# Patient Record
Sex: Male | Born: 2000 | Race: Black or African American | Hispanic: No | Marital: Single
Health system: Southern US, Community
[De-identification: ages and names within clinical notes are randomized; demographics above are authoritative.]

---
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
## 2021-08-04 ENCOUNTER — Emergency Department (HOSPITAL_COMMUNITY)
Admission: EM | Admit: 2021-08-04 | Discharge: 2021-08-04 | Disposition: A | Discharge status: Home / Self Care | Payer: Medicaid Other | Attending: Emergency Medicine | Admitting: Emergency Medicine

## 2021-08-04 ENCOUNTER — Encounter (HOSPITAL_COMMUNITY): Payer: Self-pay | Admitting: Emergency Medicine

## 2021-08-04 ENCOUNTER — Emergency Department (HOSPITAL_COMMUNITY): Payer: Medicaid Other

## 2021-08-04 DIAGNOSIS — Y93G3 Activity, cooking and baking: Secondary | ICD-10-CM | POA: Insufficient documentation

## 2021-08-04 DIAGNOSIS — S61011A Laceration without foreign body of right thumb without damage to nail, initial encounter: Secondary | ICD-10-CM | POA: Insufficient documentation

## 2021-08-04 DIAGNOSIS — Y990 Civilian activity done for income or pay: Secondary | ICD-10-CM | POA: Diagnosis not present

## 2021-08-04 DIAGNOSIS — Z23 Encounter for immunization: Secondary | ICD-10-CM | POA: Insufficient documentation

## 2021-08-04 DIAGNOSIS — S6992XA Unspecified injury of left wrist, hand and finger(s), initial encounter: Secondary | ICD-10-CM

## 2021-08-04 DIAGNOSIS — W269XXA Contact with unspecified sharp object(s), initial encounter: Secondary | ICD-10-CM | POA: Diagnosis not present

## 2021-08-04 MED ORDER — TETANUS-DIPHTH-ACELL PERTUSSIS 5-2.5-18.5 LF-MCG/0.5 IM SUSY
0.5000 mL | PREFILLED_SYRINGE | Freq: Once | INTRAMUSCULAR | Status: AC
  Administered 2021-08-04: 0.5 mL via INTRAMUSCULAR
  Filled 2021-08-04: qty 0.5

## 2021-08-04 MED ORDER — OXYCODONE-ACETAMINOPHEN 5-325 MG PO TABS
1.0000 | ORAL_TABLET | Freq: Once | ORAL | Status: DC
  Filled 2021-08-04: qty 1

## 2021-08-04 NOTE — Discharge Instructions (Addendum)
As we discussed, x-ray imaging of your thumb did not reveal any fracture or dislocation.  I recommend that you keep the wound clean and dry and perform regular dressing changes to assist with proper wound healing.  I recommend monitoring for signs of infection including redness, warmth, and yellow drainage.  I also recommend that you take Tylenol/ibuprofen regularly for pain control.  Return if development of any new or worsening symptoms.

## 2021-08-04 NOTE — ED Notes (Signed)
I provided reinforced discharge education based off of discharge instructions. Pt acknowledged and understood my education. Pt had no further questions/concerns for provider/myself.  °

## 2021-08-04 NOTE — ED Notes (Signed)
I opened Oxycodone packet to provide for pt, upon opening, pt changed his mind and refused, pt expressed he felt comfortable with his pain at this time, Waverly Ferrari RN witnessed waste into medication waste collection bin in triage supply.

## 2021-08-04 NOTE — ED Triage Notes (Signed)
Pt states that he cut the tip of of his L thumb at work around 2p. Unsure of last tetanus. Bleeding controlled with dressing, but oozing resumes when dressing removed.

## 2021-08-04 NOTE — ED Provider Notes (Signed)
[facility]-EMERGENCY DEPT Provider Note   CSN: [id number] Arrival date & time: 08/04/21  2106     History  Chief Complaint  Patient presents with   Laceration    Johnny Hernandez is a 21 y.o. male.  Patient with no pertinent past medical history presents today with complaints of left thumb injury.  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  He states he has been holding pressure on the wound since then.  He is unsure of last tetanus.  The history is provided by the patient. No language interpreter was used.  Laceration      Home Medications Prior to Admission medications   Not on File      Allergies    Patient has no known allergies.    Review of Systems   Review of Systems  Skin:  Positive for wound.  All other systems reviewed and are negative.   Physical Exam Updated Vital Signs BP (!) 165/114 (BP Location: Right Arm)   Pulse 69   Temp 98.2 F (36.8 C) (Oral)   Resp 18   Ht 6' (1.829 m)   Wt 63.5 kg   BMI 18.99 kg/m  Physical Exam Vitals and nursing note reviewed.  Constitutional:      General: He is not in acute distress.    Appearance: Normal appearance. He is normal weight. He is not ill-appearing, toxic-appearing or diaphoretic.  HENT:     Head: Normocephalic and atraumatic.  Cardiovascular:     Rate and Rhythm: Normal rate.  Pulmonary:     Effort: Pulmonary effort is normal. No respiratory distress.  Musculoskeletal:        General: Normal range of motion.     Cervical back: Normal range of motion.     Comments: Left thumb with open slice wound to distal tip. Distal tip of the fingernail missing, no nailbed involvement. Wound is bleeding slowly, controlled with pressure. Distal sensation intact, capillary refill less than 2 seconds. Full ROM noted to the thumb. Wound is clean, without evidence of foreign body, non-infectious appearing. No  visualization of bone.  Skin:    General: Skin is warm and dry.  Neurological:     General: No focal deficit present.     Mental Status: He is alert.  Psychiatric:        Mood and Affect: Mood normal.        Behavior: Behavior normal.     ED Results / Procedures / Treatments   Labs (all labs ordered are listed, but only abnormal results are displayed) Labs Reviewed - No data to display  EKG None  Radiology DG Finger Thumb Left  Result Date: 08/04/2021 CLINICAL DATA:  Thumb injury EXAM: LEFT THUMB 2+V COMPARISON:  None Available. FINDINGS: There is no evidence of fracture or dislocation. There is no evidence of arthropathy or other focal bone abnormality. Soft tissues are unremarkable. IMPRESSION: Negative. Electronically Signed   By: Jasmine Pang M.D.   On: 08/04/2021 22:06    Procedures Procedures    Medications Ordered in ED Medications  Tdap (BOOSTRIX) injection 0.5 mL (has no administration in time range)  oxyCODONE-acetaminophen (PERCOCET/ROXICET) 5-325 MG per tablet 1 tablet (has no administration in time range)    ED Course/ Medical Decision Making/ A&P  Medical Decision Making Amount and/or Complexity of Data Reviewed Radiology: ordered.  Risk Prescription drug management.   Patient with injury to the distal tip of the left thumb.  He is afebrile, nontoxic-appearing, and in no acute distress with reassuring vital signs.  X-ray imaging obtained of the left thumb which was unremarkable for acute findings.  I have personally reviewed and interpreted these images and agree with radiology interpretation.  Wound cleaned with pressure irrigation and nonadherent dressing placed over the wound with Coban over the dressing.  Wound care tolerated well.  Wound is unable to be closed with sutures given tension and distance between the skin edges. No nailbed involvement. Tdap updated.  Patient given materials to assist with dressing changes at home.   He is stable for discharge at this time, educated on red flag symptoms of prompt immediate return.  Discharged in stable condition.   Final Clinical Impression(s) / ED Diagnoses Final diagnoses:  Thumb injury, left, initial encounter    Rx / DC Orders ED Discharge Orders     None     An After Visit Summary was printed and given to the patient.     Silva Bandy, PA-C 08/04/21 2305    Rozelle Logan, DO 08/05/21 0003

## 2023-12-11 IMAGING — CR DG FINGER THUMB 2+V*L*
3 series · 3 of 3 positions shown · non-contrast
Comparison: None Available.

CLINICAL DATA: Thumb injury

EXAM:
LEFT THUMB 2+V

[x finger pa left]
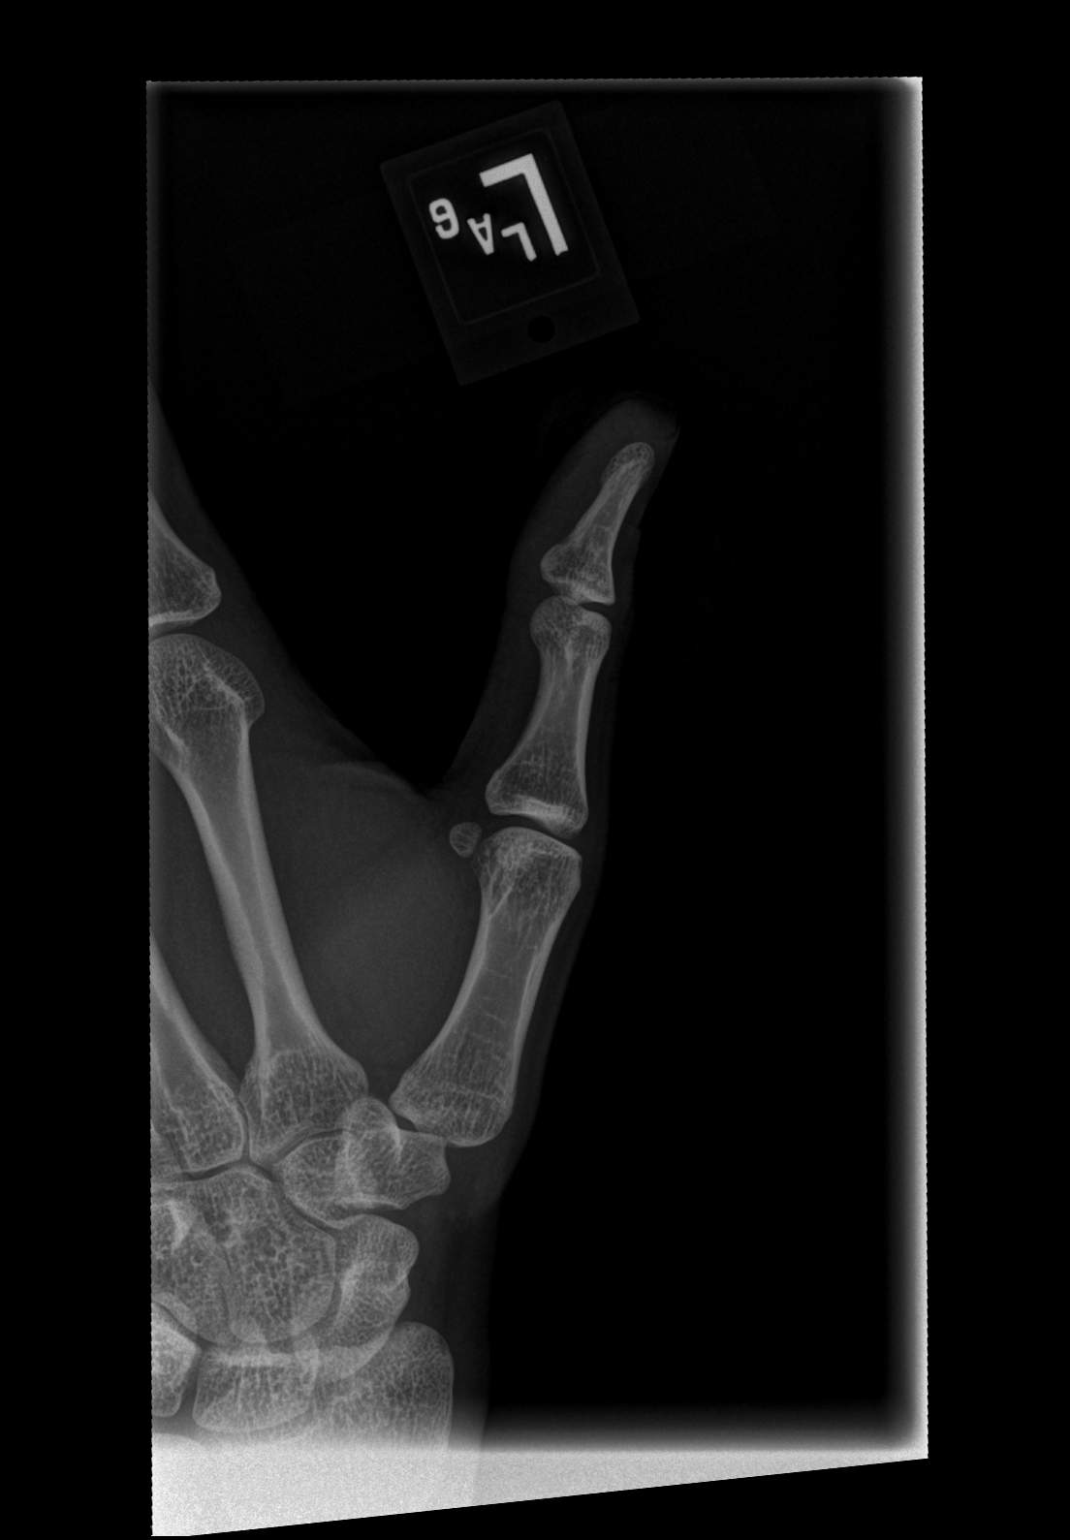

[x finger obl left]
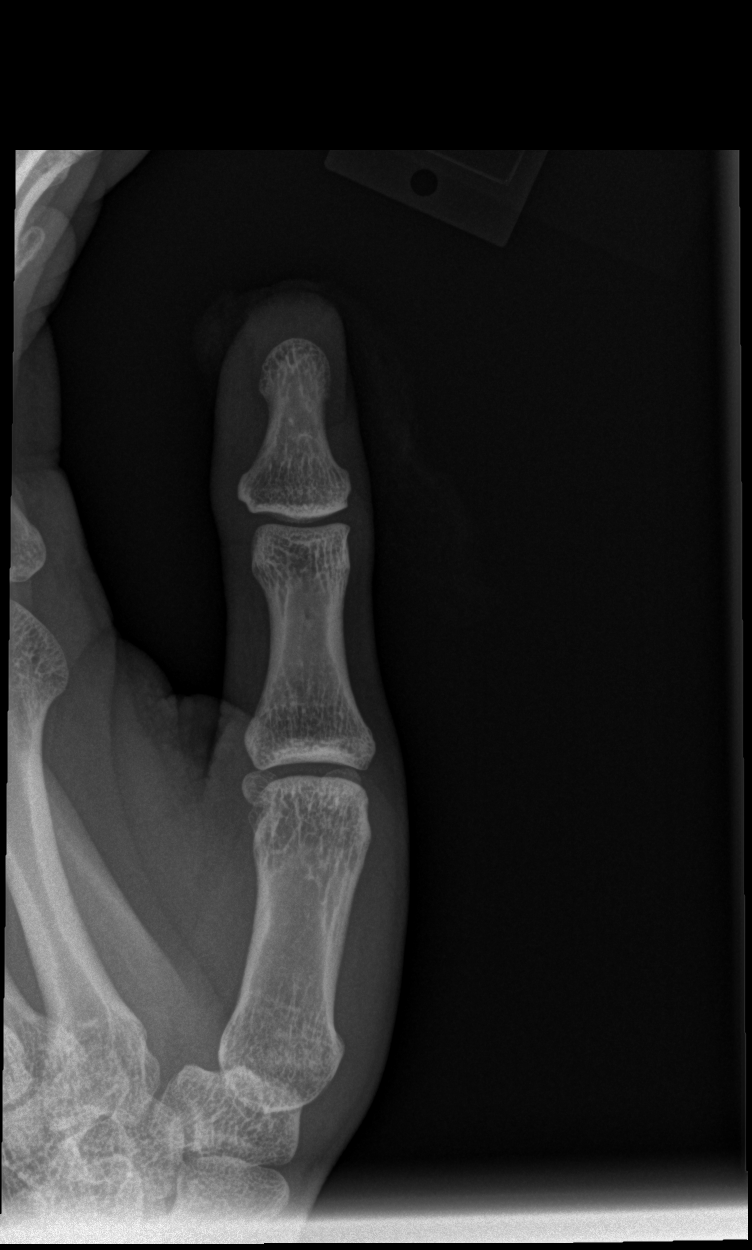

[x finger lat left]
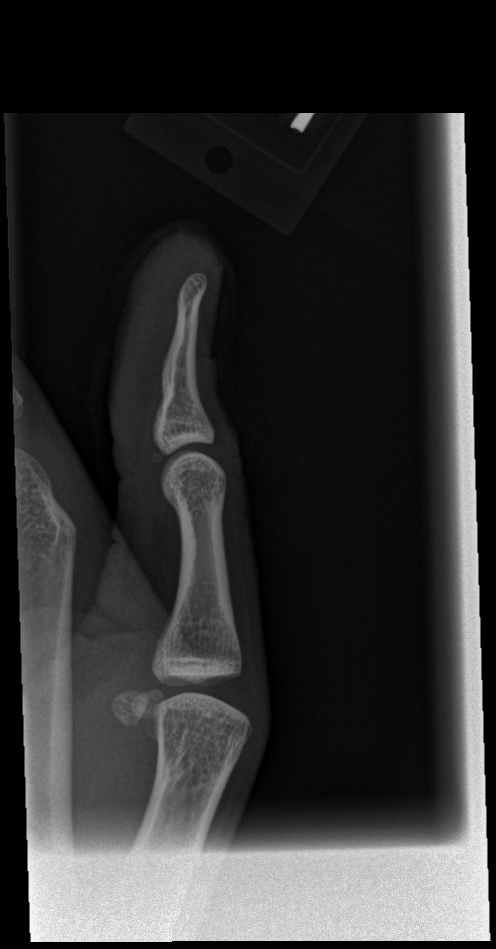

[3 of 3 positions shown; findings below may reference images not displayed]

FINDINGS: There is no evidence of fracture or dislocation. There is no
evidence of arthropathy or other focal bone abnormality. Soft
tissues are unremarkable.
IMPRESSION: Negative.
